# Patient Record
Sex: Female | Born: 1970 | Race: White | Hispanic: No | Marital: Single
Health system: Southern US, Community
[De-identification: ages and names within clinical notes are randomized; demographics above are authoritative.]

---

## 1997-11-03 ENCOUNTER — Other Ambulatory Visit: Admission: RE | Admit: 1997-11-03 | Discharge: 1997-11-03 | Payer: Self-pay | Admitting: *Deleted

## 1998-02-24 ENCOUNTER — Ambulatory Visit: Admission: RE | Admit: 1998-02-24 | Discharge: 1998-02-24 | Payer: Self-pay | Admitting: Obstetrics and Gynecology

## 1998-03-16 ENCOUNTER — Other Ambulatory Visit: Admission: RE | Admit: 1998-03-16 | Discharge: 1998-03-16 | Payer: Self-pay | Admitting: Obstetrics and Gynecology

## 1999-04-04 ENCOUNTER — Other Ambulatory Visit: Admission: RE | Admit: 1999-04-04 | Discharge: 1999-04-04 | Payer: Self-pay | Admitting: Obstetrics and Gynecology

## 2000-03-27 ENCOUNTER — Encounter: Payer: Self-pay | Admitting: Emergency Medicine

## 2000-03-27 ENCOUNTER — Emergency Department (HOSPITAL_COMMUNITY): Admission: EM | Admit: 2000-03-27 | Discharge: 2000-03-27 | Payer: Self-pay | Admitting: Emergency Medicine

## 2000-04-02 ENCOUNTER — Encounter: Payer: Self-pay | Admitting: Neurosurgery

## 2000-04-02 ENCOUNTER — Ambulatory Visit (HOSPITAL_COMMUNITY): Admission: RE | Admit: 2000-04-02 | Discharge: 2000-04-02 | Payer: Self-pay | Admitting: Neurosurgery

## 2000-04-05 ENCOUNTER — Other Ambulatory Visit: Admission: RE | Admit: 2000-04-05 | Discharge: 2000-04-05 | Payer: Self-pay | Admitting: Obstetrics and Gynecology

## 2000-10-05 ENCOUNTER — Encounter: Payer: Self-pay | Admitting: Neurosurgery

## 2000-10-05 ENCOUNTER — Ambulatory Visit (HOSPITAL_COMMUNITY): Admission: RE | Admit: 2000-10-05 | Discharge: 2000-10-05 | Payer: Self-pay | Admitting: Neurosurgery

## 2001-10-01 ENCOUNTER — Other Ambulatory Visit: Admission: RE | Admit: 2001-10-01 | Discharge: 2001-10-01 | Payer: Self-pay | Admitting: Obstetrics and Gynecology

## 2002-11-16 ENCOUNTER — Other Ambulatory Visit: Admission: RE | Admit: 2002-11-16 | Discharge: 2002-11-16 | Payer: Self-pay | Admitting: Obstetrics and Gynecology

## 2004-08-29 ENCOUNTER — Other Ambulatory Visit: Admission: RE | Admit: 2004-08-29 | Discharge: 2004-08-29 | Payer: Self-pay | Admitting: Obstetrics and Gynecology

## 2005-10-25 ENCOUNTER — Ambulatory Visit (HOSPITAL_COMMUNITY): Admission: RE | Admit: 2005-10-25 | Discharge: 2005-10-25 | Payer: Self-pay | Admitting: Obstetrics and Gynecology

## 2010-02-01 ENCOUNTER — Encounter: Admission: RE | Admit: 2010-02-01 | Discharge: 2010-02-01 | Payer: Self-pay | Admitting: Obstetrics and Gynecology

## 2018-06-10 ENCOUNTER — Other Ambulatory Visit (HOSPITAL_COMMUNITY): Payer: Self-pay | Admitting: Obstetrics and Gynecology

## 2018-06-10 DIAGNOSIS — R1909 Other intra-abdominal and pelvic swelling, mass and lump: Secondary | ICD-10-CM

## 2018-06-12 ENCOUNTER — Encounter (HOSPITAL_COMMUNITY): Payer: Self-pay | Admitting: Radiology

## 2018-06-12 ENCOUNTER — Ambulatory Visit (HOSPITAL_COMMUNITY)
Admission: RE | Admit: 2018-06-12 | Discharge: 2018-06-12 | Disposition: A | Discharge status: Home / Self Care | Payer: BLUE CROSS/BLUE SHIELD | Source: Ambulatory Visit | Attending: Obstetrics and Gynecology | Admitting: Obstetrics and Gynecology

## 2018-06-12 DIAGNOSIS — R1909 Other intra-abdominal and pelvic swelling, mass and lump: Secondary | ICD-10-CM | POA: Diagnosis present

## 2018-06-12 MED ORDER — IOHEXOL 300 MG/ML  SOLN
100.0000 mL | Freq: Once | INTRAMUSCULAR | Status: AC | PRN
  Administered 2018-06-12: 100 mL via INTRAVENOUS

## 2018-06-12 MED ORDER — SODIUM CHLORIDE (PF) 0.9 % IJ SOLN
INTRAMUSCULAR | Status: AC
  Filled 2018-06-12: qty 50

## 2020-01-04 ENCOUNTER — Telehealth: Payer: Self-pay | Admitting: Interventional Cardiology

## 2020-01-04 NOTE — Telephone Encounter (Signed)
      I went in pt's chart, I did not need this pt's cahrt

## 2020-08-12 IMAGING — CT CT ABD-PELV W/ CM
2 of 5 series · 14 of 46 positions shown, 16 images · IV contrast (APPLIED)
Comparison: None

CLINICAL DATA: Patient reports mass and left lower quadrant area
for 7-10 days. Also complains of left lower quadrant pain and
hematuria for years

EXAM:
CT ABDOMEN AND PELVIS WITH CONTRAST
TECHNIQUE: Multidetector CT imaging of the abdomen and pelvis was performed
using the standard protocol following bolus administration of
intravenous contrast.
CONTRAST:  100mL OMNIPAQUE IOHEXOL 300 MG/ML  SOLN

[Series 2: axial st · axial · 0.62mm/px · z∈[-448,-78]mm · 11 of 84 slices shown, 13 images]
[im 5/84  soft-tissue]
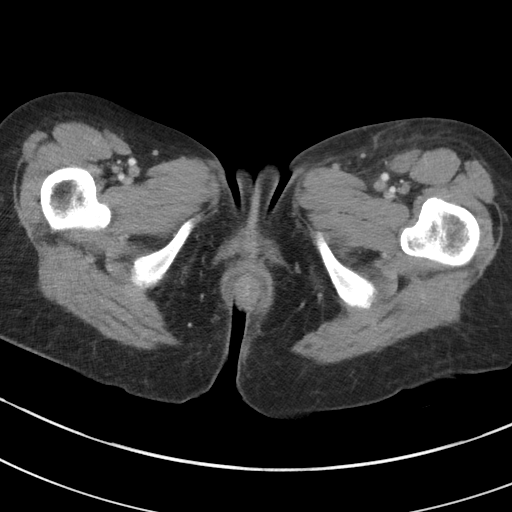
[im 5/84  bone]
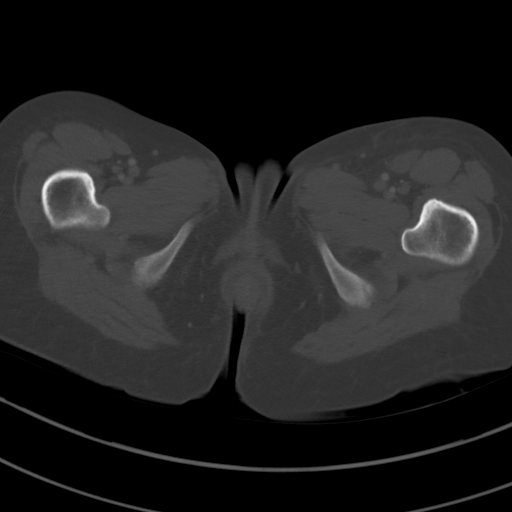
[im 14/84  soft-tissue]
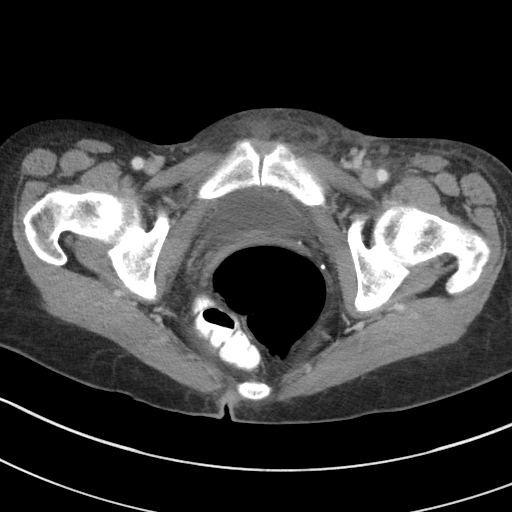
[im 19/84  soft-tissue]
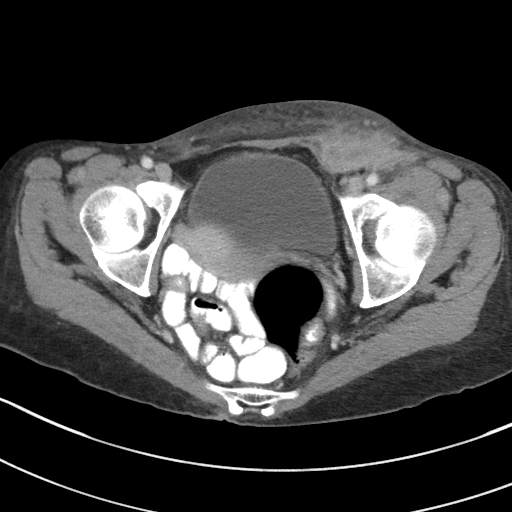
[im 28/84  soft-tissue]
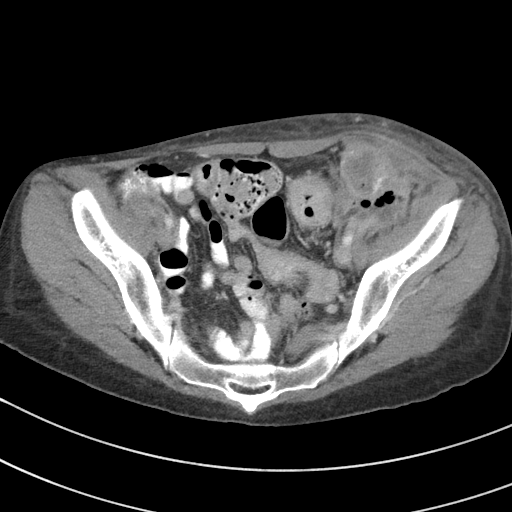
[im 33/84  soft-tissue]
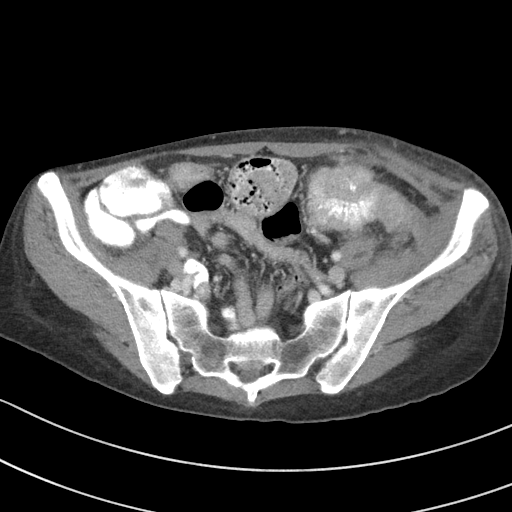
[im 42/84  soft-tissue]
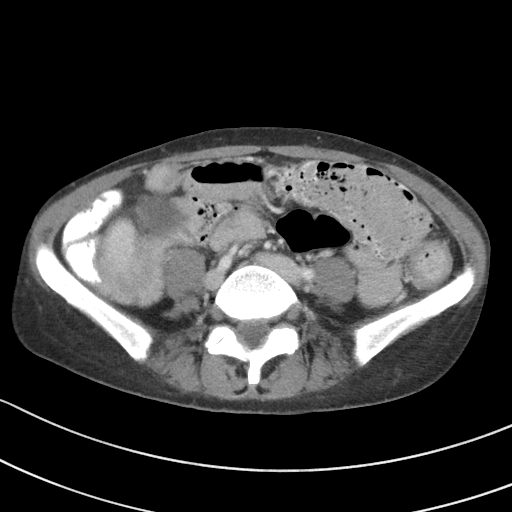
[im 51/84  soft-tissue]
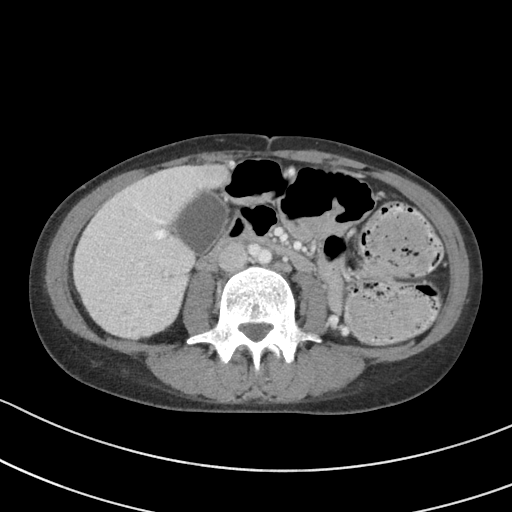
[im 56/84  soft-tissue]
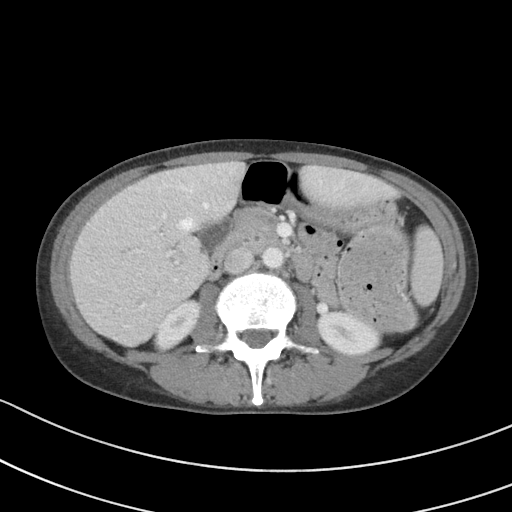
[im 65/84  soft-tissue]
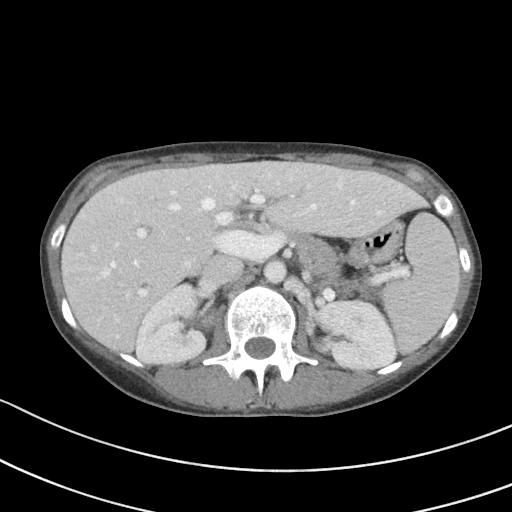
[im 65/84  bone]
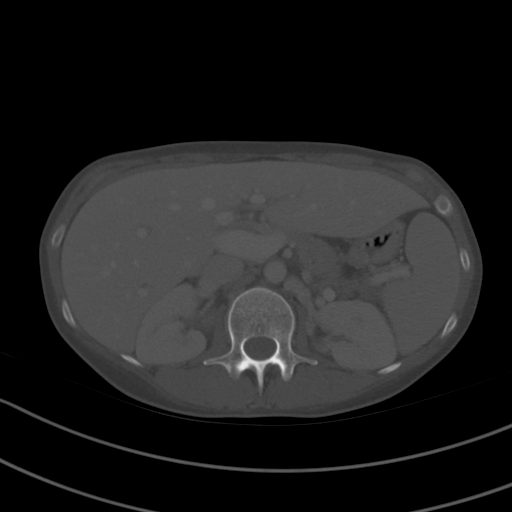
[im 70/84  soft-tissue]
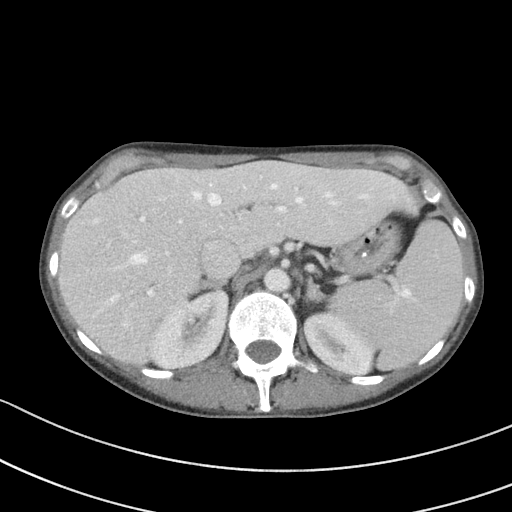
[im 79/84  soft-tissue]
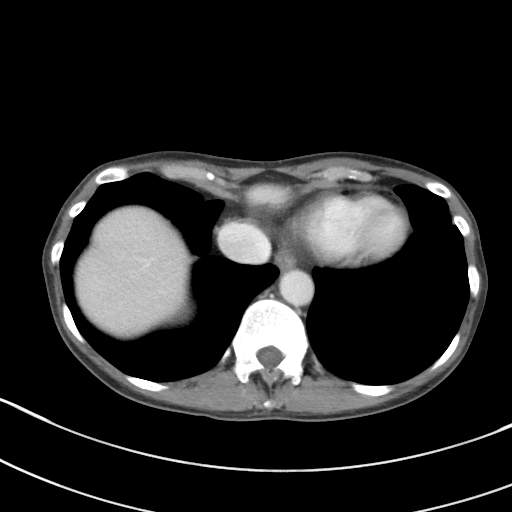

[Series 5: coronal st · coronal · 0.62mm/px · 3 of 65 slices shown]
[im 22/65  soft-tissue]
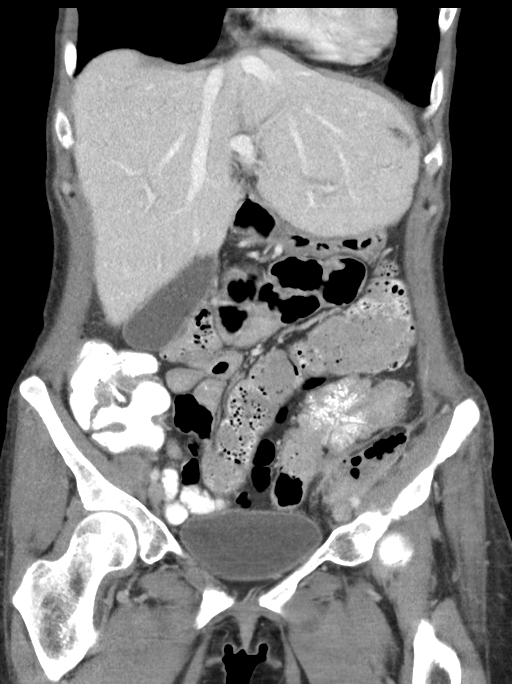
[im 29/65  soft-tissue]
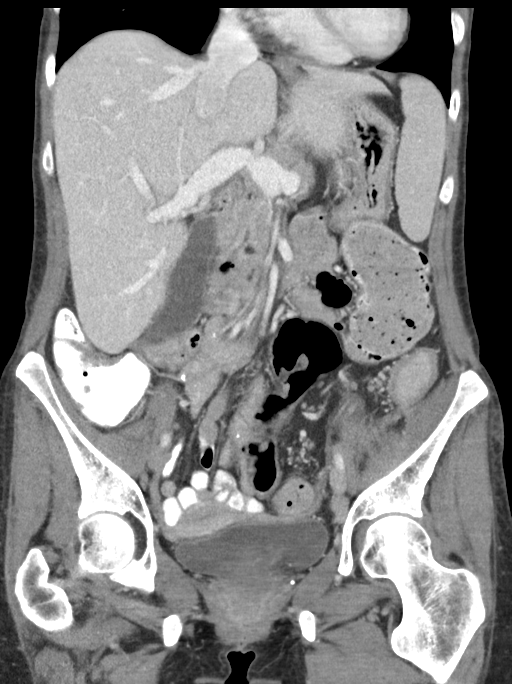
[im 36/65  soft-tissue]
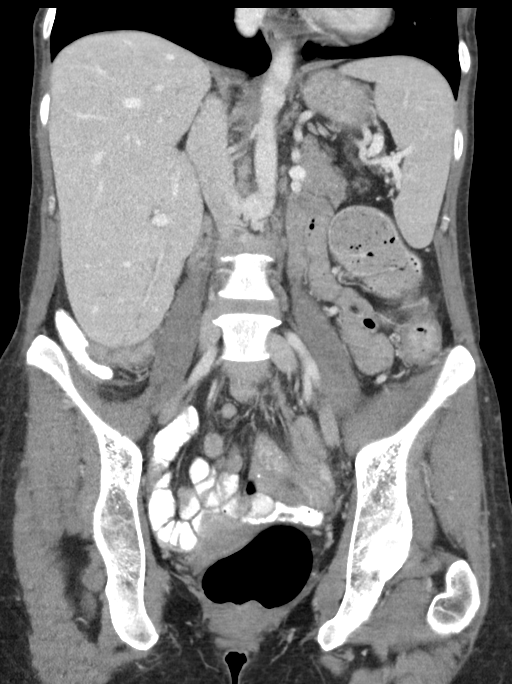

[14 of 46 positions shown; findings below may reference images not displayed]

FINDINGS: Lower chest: No acute abnormality.

Hepatobiliary: Low-attenuation structure within segment 2 measures 6
mm and is too small to characterize, image [DATE]. There is
enlargement of the lateral segment of left lobe of liver. The
contour of the liver appears slightly nodular, image 33/2. Normal
appearance of the gallbladder. No biliary dilatation.

Pancreas: Unremarkable. No pancreatic ductal dilatation or
surrounding inflammatory changes.

Spleen: The spleen measures 9.5 by 5.6 by 12.0 cm (volume = 330
cm^3). No focal splenic abnormality.

Adrenals/Urinary Tract: Adrenal glands are unremarkable. Kidneys are
normal, without renal calculi, focal lesion, or hydronephrosis.
Bladder is unremarkable.

Stomach/Bowel: Normal appearance of the stomach. The small bowel
loops have a normal caliber. There is increased caliber of the
transverse colon and descending colon which contains a large volume
of stool. Partially calcified, necrotic circumferential mass is
identified involving the proximal sigmoid colon measuring 12.1 x
by 6.2 cm. Associated luminal compromise of the sigmoid colon is
identified. Additionally, the mass extends into the surrounding soft
tissues with invasion of the left lower quadrant ventral abdominal
wall. Overlying inflammatory changes within the left lower quadrant
abdominal wall is noted including subcutaneous fat stranding. The
mass also extends along the left iliacus muscle.

Vascular/Lymphatic: Normal appearance of the abdominal aorta. There
is left abdominal varicosities. The portal vein appears increased in
diameter measuring 1.8 cm and there is recanalization of the
umbilical vein. Enhancing left inguinal lymph node measures 1.2 cm,
image 69/2. Left external iliac lymph node measures 1 cm, image
64/2. Multiple tiny lymph nodes are identified within the soft
tissues surrounding the left lower quadrant mass, image 47/2.

Reproductive: The uterus appears normal.  No adnexal mass.

Other: No ascites identified within the abdomen or pelvis. I do not
see any evidence for omental caking. Small peritoneal nodule within
the left lower quadrant of the abdomen measures 3 mm. Within the
left side of pelvis just above the bladder dome there is a 5 mm
nodule, image [DATE].

Musculoskeletal: No aggressive appearing lytic or sclerotic bone
lesions identified.
IMPRESSION: 1. There is a large mass within the left lower quadrant of the
abdomen which appears to encase the proximal sigmoid colon. This
mass invades the surrounding soft tissues and extends into the left
lower quadrant ventral abdominal wall. The mass appears necrotic
containing internal areas of gas and fluid. There is associated
proximal dilatation with accumulation of stool in the transverse
colon and descending colon. Primary differential consideration is
for primary colonic neoplasm. Surgical consultation is advised.
2. Enlarged left external iliac and left inguinal lymph nodes are
identified. There are also multiple small milli metric nodules
within the left lower quadrant of the abdomen which may represent
small lymph nodes or peritoneal disease.
3. Morphologic features of the liver suggestive of cirrhosis.
Stigmata of portal venous hypertension noted.
4. Small nodule in segment 2 of the liver measuring 6 mm. This is
too small to reliably characterize.
5. These results were called by telephone at the time of
interpretation on 06/12/2018 at [DATE] to Dr. YAKENI ESA , who
verbally acknowledged these results.

## 2022-12-06 ENCOUNTER — Other Ambulatory Visit: Payer: Self-pay

## 2022-12-06 ENCOUNTER — Encounter: Payer: Self-pay | Admitting: Neurology

## 2022-12-06 DIAGNOSIS — R202 Paresthesia of skin: Secondary | ICD-10-CM

## 2022-12-27 ENCOUNTER — Encounter: Payer: BLUE CROSS/BLUE SHIELD | Admitting: Neurology

## 2022-12-27 ENCOUNTER — Ambulatory Visit (INDEPENDENT_AMBULATORY_CARE_PROVIDER_SITE_OTHER): Payer: 59 | Admitting: Neurology

## 2022-12-27 DIAGNOSIS — R202 Paresthesia of skin: Secondary | ICD-10-CM | POA: Diagnosis not present

## 2022-12-27 NOTE — Procedures (Signed)
[facility]  [address] [place], [address]  [place], [place] [address] Tel: [phone] Fax: [phone] Test Date:  12/27/2022  Patient: Karen Frey DOB: 04-15-1971 Physician: Nita Sickle, DO  Sex: Female Height: 5\' 7"  Ref Phys: Scherrie Bateman, FNP  ID#: [id number]   Technician:    History: This is a 52 year old female referred for evaluation of bilateral leg pain.  NCV & EMG Findings: Electrodiagnostic testing of the right lower extremity and additional studies of the left shows: Bilateral sural and superficial peroneal sensory responses are within normal limits. Bilateral peroneal and tibial motor responses are within normal limits. Bilateral tibial H reflex studies are within normal limits. There is no evidence of active or chronic motor axonal changes affecting any of the tested muscles.  Motor unit configuration and recruitment pattern is within normal limits.   Impression: This is a normal study of the lower extremities.  In particular, there is no evidence of a large fiber sensorimotor polyneuropathy or lumbosacral radiculopathy.    ___________________________ Nita Sickle, DO    Nerve Conduction Studies   Stim Site NR Peak (ms) Norm Peak (ms) O-P Amp (V) Norm O-P Amp  Left Sup Peroneal Anti Sensory (Ant Lat Mall)  32 C  12 cm    2.7 <4.6 7.2 >4  Right Sup Peroneal Anti Sensory (Ant Lat Mall)  32 C  12 cm    3.0 <4.6 7.4 >4  Left Sural Anti Sensory (Lat Mall)  32 C  Calf    2.8 <4.6 15.3 >4  Right Sural Anti Sensory (Lat Mall)  32 C  Calf    2.6 <4.6 15.6 >4     Stim Site NR Onset (ms) Norm Onset (ms) O-P Amp (mV) Norm O-P Amp Site1 Site2 Delta-0 (ms) Dist (cm) Vel (m/s) Norm Vel (m/s)  Left Peroneal Motor (Ext Dig Brev)  32 C  Ankle    3.4 <6.0 7.7 >2.5 B Fib Ankle 7.0 36.0 51 >40  B Fib    10.4  7.3  Poplt B Fib 1.3 8.0 62 >40  Poplt    11.7  7.0         Right Peroneal Motor (Ext Dig Brev)  32 C  Ankle    3.7 <6.0 7.5 >2.5 B Fib Ankle  7.5 35.0 47 >40  B Fib    11.2  6.9  Poplt B Fib 1.5 9.0 60 >40  Poplt    12.7  6.0         Left Tibial Motor (Abd Hall Brev)  32 C  Ankle    3.4 <6.0 21.1 >4 Knee Ankle 9.0 39.0 43 >40  Knee    12.4  16.6         Right Tibial Motor (Abd Hall Brev)  32 C  Ankle    3.4 <6.0 20.7 >4 Knee Ankle 9.1 44.0 48 >40  Knee    12.5  14.0          Electromyography   Side Muscle Ins.Act Fibs Fasc Recrt Amp Dur Poly Activation Comment  Right AntTibialis Nml Nml Nml Nml Nml Nml Nml Nml N/A  Right Gastroc Nml Nml Nml Nml Nml Nml Nml Nml N/A  Right Flex Dig Long Nml Nml Nml Nml Nml Nml Nml Nml N/A  Right RectFemoris Nml Nml Nml Nml Nml Nml Nml Nml N/A  Right GluteusMed Nml Nml Nml Nml Nml Nml Nml Nml N/A  Left AntTibialis Nml Nml Nml Nml Nml Nml Nml Nml N/A  Left  Gastroc Nml Nml Nml Nml Nml Nml Nml Nml N/A  Left Flex Dig Long Nml Nml Nml Nml Nml Nml Nml Nml N/A  Left RectFemoris Nml Nml Nml Nml Nml Nml Nml Nml N/A  Left GluteusMed Nml Nml Nml Nml Nml Nml Nml Nml N/A      Waveforms:
